# Patient Record
Sex: Male | Born: 1990 | Race: Black or African American | Hispanic: No | Marital: Single | Smoking: Current every day smoker
Health system: Southern US, Community
[De-identification: ages and names within clinical notes are randomized; demographics above are authoritative.]

---
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
## 2015-09-23 ENCOUNTER — Emergency Department
Admission: EM | Admit: 2015-09-23 | Discharge: 2015-09-23 | Disposition: A | Discharge status: Home / Self Care | Payer: Self-pay | Attending: Emergency Medicine | Admitting: Emergency Medicine

## 2015-09-23 ENCOUNTER — Encounter: Payer: Self-pay | Admitting: Emergency Medicine

## 2015-09-23 DIAGNOSIS — H109 Unspecified conjunctivitis: Secondary | ICD-10-CM | POA: Insufficient documentation

## 2015-09-23 DIAGNOSIS — F17210 Nicotine dependence, cigarettes, uncomplicated: Secondary | ICD-10-CM | POA: Insufficient documentation

## 2015-09-23 MED ORDER — SULFACETAMIDE SODIUM 10 % OP SOLN: OPHTHALMIC | Status: AC

## 2015-09-23 NOTE — ED Provider Notes (Signed)
[facility] Emergency Department Provider Note ____________________________________________  Time seen: Approximately 3:47 PM  I have reviewed the triage vital signs and the nursing notes.   HISTORY  Chief Complaint Conjunctivitis   HPI Colton Lang is a 25 y.o. male who presents to the emergency department for evaluation of bilateral eye irritation, redness, itching, and drainage. Symptoms started approximately 5 days ago and a right and the left eye became infected yesterday. He denies known exposures to conjunctivitis. He denies injury to the eyes He does not wear contact lenses. He has not been exposed to fire or welding.  History reviewed. No pertinent past medical history.  There are no active problems to display for this patient.   History reviewed. No pertinent past surgical history.  Current Outpatient Rx  Name  Route  Sig  Dispense  Refill  . sulfacetamide (BLEPH-10) 10 % ophthalmic solution      2 drops in each eye every 2 hours while awake for 2 days then 2 drops in both eyes every 4 hours for 5 days.   5 mL   0     Allergies Penicillins  No family history on file.  Social History Social History  Substance Use Topics  . Smoking status: Current Every Day Smoker -- 1.00 packs/day    Types: Cigarettes  . Smokeless tobacco: None  . Alcohol Use: Yes     Comment: occasional    Review of Systems   Constitutional: No fever/chills Eyes: Negative for visual changes. Negative for pain. Musculoskeletal: Negative for pain. Skin: Negative for rash. Neurological: Negative for headaches, focal weakness or numbness. Allergic: Negative for seasonal allergies. ____________________________________________  PHYSICAL EXAM:  VITAL SIGNS: ED Triage Vitals  Enc Vitals Group     BP 09/23/15 1544 121/73 mmHg     Pulse Rate 09/23/15 1544 77     Resp 09/23/15 1544 18     Temp 09/23/15 1544 98.5 F (36.9 C)     Temp Source 09/23/15  1544 Oral     SpO2 09/23/15 1544 97 %     Weight 09/23/15 1544 186 lb (84.369 kg)     Height 09/23/15 1544 5\' 8"  (1.727 m)     Head Cir --      Peak Flow --      Pain Score --      Pain Loc --      Pain Edu? --      Excl. in GC? --     Constitutional: Alert and oriented. Well appearing and in no acute distress. Eyes: Visual acuity--see nursing documentation; No globe trauma; Eyelids with purulent drainage in the lower lids; Sclera appears injected.  Eyelids not inverted. Conjunctiva appears erythematous, sparing the limbus; Cornea normal without fluorescein stain exam. Head: Atraumatic. Nose: No congestion/rhinnorhea. Mouth/Throat: Mucous membranes are moist.  Oropharynx non-erythematous. Respiratory: Respirations even and unlabored. Musculoskeletal:Normal ROM x 4 extremities. Neurologic:  Normal speech and language. No gross focal neurologic deficits are appreciated. Speech is normal. No gait instability. Skin:  Skin is warm, dry and intact. No rash noted. Psychiatric: Mood and affect are normal. Speech and behavior are normal.  ____________________________________________   LABS (all labs ordered are listed, but only abnormal results are displayed)  Labs Reviewed - No data to display ____________________________________________  EKG   ____________________________________________  RADIOLOGY   ____________________________________________   PROCEDURES  Procedure(s) performed: None  ____________________________________________   INITIAL IMPRESSION / ASSESSMENT AND PLAN / ED COURSE  Pertinent labs & imaging results that  were available during my care of the patient were reviewed by me and considered in my medical decision making (see chart for details).  Patient to receive prescriptions for Bleph-10.  He was advised to follow up with ophthalmology for symptoms that are not improving over the next 2-3 days. He was  also advised to return to the ER for symptoms that  change or worsen if unable to schedule an appointment.  ____________________________________________   FINAL CLINICAL IMPRESSION(S) / ED DIAGNOSES  Final diagnoses:  Bilateral conjunctivitis    Note:  This document was prepared using Dragon voice recognition software and may include unintentional dictation errors.    Chinita PesterCari B Serene Kopf, FNP 09/23/15 1811  Minna AntisKevin Paduchowski, MD 09/23/15 2008

## 2015-09-23 NOTE — ED Notes (Signed)
Pt presents to ED with left eye redness, swelling and tearing for 4-5 days. Pt denies any other symptoms except some nasal congestion.

## 2015-09-23 NOTE — Discharge Instructions (Signed)

## 2016-06-28 ENCOUNTER — Emergency Department
Admission: EM | Admit: 2016-06-28 | Discharge: 2016-06-28 | Disposition: A | Discharge status: Home / Self Care | Payer: Self-pay | Attending: Emergency Medicine | Admitting: Emergency Medicine

## 2016-06-28 ENCOUNTER — Encounter: Payer: Self-pay | Admitting: Emergency Medicine

## 2016-06-28 DIAGNOSIS — R519 Headache, unspecified: Secondary | ICD-10-CM

## 2016-06-28 DIAGNOSIS — R110 Nausea: Secondary | ICD-10-CM | POA: Insufficient documentation

## 2016-06-28 DIAGNOSIS — R51 Headache: Secondary | ICD-10-CM | POA: Insufficient documentation

## 2016-06-28 DIAGNOSIS — F17210 Nicotine dependence, cigarettes, uncomplicated: Secondary | ICD-10-CM | POA: Insufficient documentation

## 2016-06-28 MED ORDER — IBUPROFEN 600 MG PO TABS
600.0000 mg | ORAL_TABLET | Freq: Once | ORAL | Status: AC
  Administered 2016-06-28: 600 mg via ORAL
  Filled 2016-06-28: qty 1

## 2016-06-28 MED ORDER — BUTALBITAL-APAP-CAFFEINE 50-325-40 MG PO TABS
2.0000 | ORAL_TABLET | ORAL | Status: AC
  Administered 2016-06-28: 2 via ORAL
  Filled 2016-06-28: qty 2

## 2016-06-28 NOTE — Discharge Instructions (Signed)

## 2016-06-28 NOTE — ED Triage Notes (Signed)
Right side headache started tuesdaya nd is still there.  Says he has not taken anything for it.

## 2016-06-28 NOTE — ED Notes (Addendum)
Pt presents with headache x 2 days (after drinking ETOH); has not taken anything for it. He reports that he doesn't like to take meds; affirms nausea, photophobia. Pt also reports hx of headaches caused by stress. NAD noted.

## 2016-06-28 NOTE — ED Provider Notes (Signed)
[facility] Emergency Department Provider Note  ____________________________________________   First MD Initiated Contact with Patient 06/28/16 1055     (approximate)  I have reviewed the triage vital signs and the nursing notes.   HISTORY2  Chief Complaint Headache    HPI Colton Lang is a 26 y.o. male with no chronic medical history and a history of a few more mild headaches in the past who presents for evaluation of gradual onset headache for 2-3 days.  He states that he was drinking on Monday night and developed a headache during the night that night that has persisted.  He has had some nausea and light makes the pain worse.  He denies fever/chills, neck pain, neck stiffness, visual changes, numbness or tingling or weakness in his extremities, difficulty with balance or ambulation, vomiting, abdominal pain, diarrhea.  He reports the pain is severe, all over his head, and is a dull and throbbing pain.  He has not tried taking any medication at home because he states he does not like taking pills.  He has "just been lying around" because he does not feel well.He has had no history of trauma or injury.  His uncle has a history of migraines but he personally does not and has no other family members with migraines.  He has had no pain in or around his eyes.   History reviewed. No pertinent past medical history.  There are no active problems to display for this patient.   History reviewed. No pertinent surgical history.  Prior to Admission medications   Not on File    Allergies Penicillins  No family history on file.  Social History Social History  Substance Use Topics  . Smoking status: Current Every Day Smoker    Packs/day: 1.00    Types: Cigarettes  . Smokeless tobacco: Never Used  . Alcohol use Yes     Comment: occasional    Review of Systems Constitutional: No fever/chills Eyes: No visual changes. ENT: No sore  throat. Cardiovascular: Denies chest pain. Respiratory: Denies shortness of breath. Gastrointestinal: No abdominal pain.  Nausea, no vomiting.  No diarrhea.  No constipation. Genitourinary: Negative for dysuria. Musculoskeletal: Negative for back pain. Skin: Negative for rash. Neurological: Persistent global dull throbbing headache 2 days which is exacerbated by light. 10-point ROS otherwise negative.  ____________________________________________   PHYSICAL EXAM:  VITAL SIGNS: ED Triage Vitals  Enc Vitals Group     BP 06/28/16 0943 (!) 136/55     Pulse Rate 06/28/16 0943 64     Resp 06/28/16 0943 14     Temp 06/28/16 0943 98.9 F (37.2 C)     Temp Source 06/28/16 0943 Oral     SpO2 06/28/16 0943 96 %     Weight 06/28/16 0944 195 lb (88.5 kg)     Height 06/28/16 0944 5\' 8"  (1.727 m)     Head Circumference --      Peak Flow --      Pain Score 06/28/16 0943 8     Pain Loc --      Pain Edu? --      Excl. in GC? --     Constitutional: Alert and oriented. Well appearing and in no acute distress. Eyes: Conjunctivae are normal. PERRL. EOMI.  No nystagmus.  The light from the ophthalmoscope does seem to cause the patient increased discomfort  Head: Atraumatic. Nose: No congestion/rhinnorhea. Mouth/Throat: Mucous membranes are moist. Neck: No stridor.  No meningeal signs.  Cardiovascular: Normal rate, regular rhythm. Good peripheral circulation. Grossly normal heart sounds. Respiratory: Normal respiratory effort.  No retractions. Lungs CTAB. Gastrointestinal: Soft and nontender. No distention.  Musculoskeletal: No lower extremity tenderness nor edema. No gross deformities of extremities. Neurologic:  Normal speech and language. No gross focal neurologic deficits are appreciated.  Skin:  Skin is warm, dry and intact. No rash noted. Psychiatric: Mood and affect are normal. Speech and behavior are normal.  ____________________________________________   LABS (all labs ordered  are listed, but only abnormal results are displayed)  Labs Reviewed - No data to display ____________________________________________  EKG  None - EKG not ordered by ED physician ____________________________________________  RADIOLOGY   No results found.  ____________________________________________   PROCEDURES  Critical Care performed: No   Procedure(s) performed:   Procedures   ____________________________________________   INITIAL IMPRESSION / ASSESSMENT AND PLAN / ED COURSE  Pertinent labs & imaging results that were available during my care of the patient were reviewed by me and considered in my medical decision making (see chart for details).  The patient developed a gradual onset headache after drinking and it has persisted for 2 days.  I suspect he has a migraine based on the description of his symptoms.  I explained this to him and explained that the usual way of treating, particularly for patients who have tried oral medication at home and had failed and thus come to the emergency department, is to place an IV, provided fluids, and provided several different medications that can relieve the migraine.  He states he does not like needles and IVs and does not want any of that medicine.  I explained that we could try some pills by mouth but they are typically less effective and take longer, but he wants to do that and insists that he does not want an IV.  I will provide 2 tablets of Fioricet and ibuprofen 600 mg by mouth and then reassess.   Clinical Course as of Jun 28 1153  Wed Jun 28, 2016  1108 I reviewed the patient's prescription history over the last 12 months in the multi-state controlled substances database(s) that includes [place], [place], [place], [place], [place], [place], [place], [place], [place], [place], [place], [place], and [place].  The patient has filled no controlled substances during that time.    [CF]  1154 The patient reported to his nurse, Corrie DandyMary, that he is ready to go.  This was before he got the medicines.  I asked if he did want the medicines and he said yes.  He states that his headache feels better although he just got them and he does not want to stay any longer.  I gave my usual and customary return precautions.     [CF]    Clinical Course User Index [CF] Loleta Roseory Kenneth Lax, MD    ____________________________________________  FINAL CLINICAL IMPRESSION(S) / ED DIAGNOSES  Final diagnoses:  Acute nonintractable headache, unspecified headache type     MEDICATIONS GIVEN DURING THIS VISIT:  Medications  butalbital-acetaminophen-caffeine (FIORICET, ESGIC) 50-325-40 MG per tablet 2 tablet (2 tablets Oral Given 06/28/16 1145)  ibuprofen (ADVIL,MOTRIN) tablet 600 mg (600 mg Oral Given 06/28/16 1145)     NEW OUTPATIENT MEDICATIONS STARTED DURING THIS VISIT:  New Prescriptions   No medications on file    Modified Medications   No medications on file    Discontinued Medications   No medications on file     Note:  This document was prepared using  Dragon Chemical engineer and may include unintentional dictation errors.    Loleta Rose, MD 06/28/16 1155

## 2016-06-28 NOTE — ED Notes (Signed)
FIRST NURSE: headache x2 days , hx of similar headache

## 2017-10-01 ENCOUNTER — Emergency Department
Admission: EM | Admit: 2017-10-01 | Discharge: 2017-10-01 | Disposition: A | Discharge status: Home / Self Care | Payer: Self-pay | Attending: Emergency Medicine | Admitting: Emergency Medicine

## 2017-10-01 ENCOUNTER — Emergency Department: Payer: Self-pay

## 2017-10-01 ENCOUNTER — Other Ambulatory Visit: Payer: Self-pay

## 2017-10-01 DIAGNOSIS — Y998 Other external cause status: Secondary | ICD-10-CM | POA: Insufficient documentation

## 2017-10-01 DIAGNOSIS — S66901A Unspecified injury of unspecified muscle, fascia and tendon at wrist and hand level, right hand, initial encounter: Secondary | ICD-10-CM | POA: Insufficient documentation

## 2017-10-01 DIAGNOSIS — W228XXA Striking against or struck by other objects, initial encounter: Secondary | ICD-10-CM | POA: Insufficient documentation

## 2017-10-01 DIAGNOSIS — Y929 Unspecified place or not applicable: Secondary | ICD-10-CM | POA: Insufficient documentation

## 2017-10-01 DIAGNOSIS — F17210 Nicotine dependence, cigarettes, uncomplicated: Secondary | ICD-10-CM | POA: Insufficient documentation

## 2017-10-01 DIAGNOSIS — Y9389 Activity, other specified: Secondary | ICD-10-CM | POA: Insufficient documentation

## 2017-10-01 MED ORDER — TRIPLE ANTIBIOTIC 5-400-5000 EX OINT: TOPICAL_OINTMENT | Freq: Four times a day (QID) | CUTANEOUS | 0 refills | Status: DC

## 2017-10-01 MED ORDER — TRAMADOL HCL 50 MG PO TABS: 50.0000 mg | ORAL_TABLET | Freq: Four times a day (QID) | ORAL | 0 refills | Status: AC | PRN

## 2017-10-01 MED ORDER — TRIPLE ANTIBIOTIC 5-400-5000 EX OINT: TOPICAL_OINTMENT | Freq: Every day | CUTANEOUS | 0 refills | Status: DC

## 2017-10-01 MED ORDER — IBUPROFEN 800 MG PO TABS: 800.0000 mg | ORAL_TABLET | Freq: Three times a day (TID) | ORAL | 0 refills | Status: DC | PRN

## 2017-10-01 NOTE — ED Triage Notes (Addendum)
Right index finger injury after hitting a TV with it last night. Splinted on arrival. Pt denies any other complaints or recent illnesses.  Does report increase in stress over past few days.  Pt alert and oriented X4, active, cooperative, pt in NAD. RR even and unlabored, color WNL.

## 2017-10-01 NOTE — ED Notes (Signed)
Finger splint to left index finger.  Has 2 areas with abrasions and I left those areas open with no tape over them.

## 2017-10-01 NOTE — ED Provider Notes (Signed)
[facility] Emergency Department Provider Note  ____________________________________________  Time seen: Approximately 9:49 AM  I have reviewed the triage vital signs and the nursing notes.   HISTORY  Chief Complaint Finger Injury    HPI Colton Lang is a 27 y.o. male that presents to the emergency department for evaluation of right index finger injury.  Patient states that he was "thinking about something" when he got mad and hit the TV.  He is having pain over his right index finger.  He has having difficulty bending his finger.  He states that he feels like it does not bend, rather than being too painful to bend.  He is not having any pain over the rest of his hand and is not concerned that anything is broken.  No recent illness. No history of HTN. Tetanus shot was last year.    History reviewed. No pertinent past medical history.  There are no active problems to display for this patient.   History reviewed. No pertinent surgical history.  Prior to Admission medications   Medication Sig Start Date End Date Taking? Authorizing Provider  ibuprofen (ADVIL,MOTRIN) 800 MG tablet Take 1 tablet (800 mg total) by mouth every 8 (eight) hours as needed. 10/01/17   Enid DerryWagner, Demeco Ducksworth, PA-C  neomycin-bacitracin-polymyxin (NEOSPORIN) [id number] ointment Apply topically daily. 10/01/17   Enid DerryWagner, Jaevin Medearis, PA-C  traMADol (ULTRAM) 50 MG tablet Take 1 tablet (50 mg total) by mouth every 6 (six) hours as needed. 10/01/17 10/01/18  Enid DerryWagner, Irina Okelly, PA-C    Allergies Penicillins  No family history on file.  Social History Social History   Tobacco Use  . Smoking status: Current Every Day Smoker    Packs/day: 1.00    Types: Cigarettes  . Smokeless tobacco: Never Used  Substance Use Topics  . Alcohol use: Yes    Comment: occasional  . Drug use: No     Review of Systems  Constitutional: No fever/chills Cardiovascular: No chest pain. Respiratory: No  SOB. Gastrointestinal: No abdominal pain.  No nausea, no vomiting.  Musculoskeletal: Positive for finger pain.  Skin: Negative for rash, lacerations, ecchymosis.  Positive for abrasions. Neurological: Negative for headaches, numbness or tingling   ____________________________________________   PHYSICAL EXAM:  VITAL SIGNS: ED Triage Vitals  Enc Vitals Group     BP 10/01/17 0822 (!) 160/75     Pulse Rate 10/01/17 0822 91     Resp 10/01/17 0822 18     Temp 10/01/17 0822 (!) 100.6 F (38.1 C)     Temp Source 10/01/17 0822 Oral     SpO2 10/01/17 0822 91 %     Weight 10/01/17 0823 196 lb (88.9 kg)     Height 10/01/17 0823 5\' 8"  (1.727 m)     Head Circumference --      Peak Flow --      Pain Score 10/01/17 0823 10     Pain Loc --      Pain Edu? --      Excl. in GC? --      Constitutional: Alert and oriented. Well appearing and in no acute distress. Eyes: Conjunctivae are normal. PERRL. EOMI. Head: Atraumatic. ENT:      Ears:      Nose: No congestion/rhinnorhea.      Mouth/Throat: Mucous membranes are moist.  Neck: No stridor.   Cardiovascular: Normal rate, regular rhythm.  Good peripheral circulation. Respiratory: Normal respiratory effort without tachypnea or retractions. Lungs CTAB. Good air entry to the bases with no  decreased or absent breath sounds. Musculoskeletal: Full range of motion to all extremities. No gross deformities appreciated.  Finger held in flexion.  Pain with passive extension.  Unable to perform resisted extension or active extension.  Abrasions to knuckles. Neurologic:  Normal speech and language. No gross focal neurologic deficits are appreciated.  Skin:  Skin is warm, dry and intact. No rash noted. Psychiatric: Mood and affect are normal. Speech and behavior are normal. Patient exhibits appropriate insight and judgement.   ____________________________________________   LABS (all labs ordered are listed, but only abnormal results are  displayed)  Labs Reviewed - No data to display ____________________________________________  EKG   ____________________________________________  RADIOLOGY Lexine Baton, personally viewed and evaluated these images (plain radiographs) as part of my medical decision making, as well as reviewing the written report by the radiologist.  Dg Finger Index Right  Result Date: 10/01/2017 CLINICAL DATA:  Right index finger pain after injury last night. EXAM: RIGHT INDEX FINGER 2+V COMPARISON:  None. FINDINGS: There is no evidence of fracture or dislocation. There is no evidence of arthropathy or other focal bone abnormality. Soft tissues are unremarkable. IMPRESSION: Normal right index finger. Electronically Signed   By: Lupita Raider, M.D.   On: 10/01/2017 09:05    ____________________________________________    PROCEDURES  Procedure(s) performed:    Procedures    Medications - No data to display   ____________________________________________   INITIAL IMPRESSION / ASSESSMENT AND PLAN / ED COURSE  Pertinent labs & imaging results that were available during my care of the patient were reviewed by me and considered in my medical decision making (see chart for details).  Review of the [place] CSRS was performed in accordance of the [facility] prior to dispensing any controlled drugs.   Patient's diagnosis is consistent with extensor tendon injury.  Vital signs and exam are reassuring.  X-ray is negative for acute bony abnormalities.  Finger splint was placed.  Patient will not remove splint until evaluated by Ortho.  Patient will be discharged home with prescriptions for tramadol, ibuprofen, neosporin. Patient is to follow up with ortho as directed.  He is agreeable to call Ortho today for follow-up appointment.  Patient is given ED precautions to return to the ED for any worsening or new symptoms.     ____________________________________________  FINAL CLINICAL IMPRESSION(S) / ED  DIAGNOSES  Final diagnoses:  Injury of extensor tendon of hand, right, initial encounter      NEW MEDICATIONS STARTED DURING THIS VISIT:  ED Discharge Orders        Ordered    traMADol (ULTRAM) 50 MG tablet  Every 6 hours PRN     10/01/17 1032    ibuprofen (ADVIL,MOTRIN) 800 MG tablet  Every 8 hours PRN     10/01/17 1032    neomycin-bacitracin-polymyxin (NEOSPORIN) [phone] ointment  4 times daily,   Status:  Discontinued     10/01/17 1034    neomycin-bacitracin-polymyxin (NEOSPORIN) [phone] ointment  Daily     10/01/17 1034          This chart was dictated using voice recognition software/Dragon. Despite best efforts to proofread, errors can occur which can change the meaning. Any change was purely unintentional.    Enid Derry, PA-C 10/01/17 1524    Jene Every, MD 10/02/17 [phone]

## 2017-10-01 NOTE — Discharge Instructions (Addendum)
Your exam is consistent with an injury to the extensor tendon of the finger. No fracture was seen on xray. Please call orthopedics today for an appointment as soon as possible. Please wear splint until seen by orthopedics.

## 2017-10-01 NOTE — ED Notes (Signed)
See triage note   Presents with right index finger pain  Developed pain after hitting a TV presents with al splint to finger

## 2019-03-24 ENCOUNTER — Other Ambulatory Visit: Payer: Self-pay

## 2019-03-24 ENCOUNTER — Encounter: Payer: Self-pay | Admitting: Emergency Medicine

## 2019-03-24 ENCOUNTER — Emergency Department
Admission: EM | Admit: 2019-03-24 | Discharge: 2019-03-24 | Disposition: A | Discharge status: Home / Self Care | Payer: Self-pay | Attending: Emergency Medicine | Admitting: Emergency Medicine

## 2019-03-24 DIAGNOSIS — B349 Viral infection, unspecified: Secondary | ICD-10-CM | POA: Insufficient documentation

## 2019-03-24 DIAGNOSIS — Z20828 Contact with and (suspected) exposure to other viral communicable diseases: Secondary | ICD-10-CM | POA: Insufficient documentation

## 2019-03-24 DIAGNOSIS — F17210 Nicotine dependence, cigarettes, uncomplicated: Secondary | ICD-10-CM | POA: Insufficient documentation

## 2019-03-24 LAB — URINALYSIS, COMPLETE (UACMP) WITH MICROSCOPIC
Bacteria, UA: NONE SEEN
Bilirubin Urine: NEGATIVE
Glucose, UA: NEGATIVE mg/dL
Hgb urine dipstick: NEGATIVE
Ketones, ur: 20 mg/dL — AB
Leukocytes,Ua: NEGATIVE
Nitrite: NEGATIVE
Protein, ur: 100 mg/dL — AB
Specific Gravity, Urine: 1.027 (ref 1.005–1.030)
Squamous Epithelial / LPF: NONE SEEN (ref 0–5)
pH: 6 (ref 5.0–8.0)

## 2019-03-24 LAB — COMPREHENSIVE METABOLIC PANEL
ALT: 23 U/L (ref 0–44)
AST: 32 U/L (ref 15–41)
Albumin: 4.7 g/dL (ref 3.5–5.0)
Alkaline Phosphatase: 83 U/L (ref 38–126)
Anion gap: 11 (ref 5–15)
BUN: 9 mg/dL (ref 6–20)
CO2: 25 mmol/L (ref 22–32)
Calcium: 9.3 mg/dL (ref 8.9–10.3)
Chloride: 103 mmol/L (ref 98–111)
Creatinine, Ser: 1.01 mg/dL (ref 0.61–1.24)
GFR calc Af Amer: >60 mL/min (ref >60)
GFR calc non Af Amer: >60 mL/min (ref >60)
Glucose, Bld: 105 mg/dL — ABNORMAL HIGH (ref 70–99)
Potassium: 3.7 mmol/L (ref 3.5–5.1)
Sodium: 139 mmol/L (ref 135–145)
Total Bilirubin: 0.9 mg/dL (ref 0.3–1.2)
Total Protein: 7.7 g/dL (ref 6.5–8.1)

## 2019-03-24 LAB — CBC WITH DIFFERENTIAL/PLATELET
Abs Immature Granulocytes: 0.01 10*3/uL (ref 0.00–0.07)
Basophils Absolute: 0 10*3/uL (ref 0.0–0.1)
Basophils Relative: 1 %
Eosinophils Absolute: 0.1 10*3/uL (ref 0.0–0.5)
Eosinophils Relative: 1 %
HCT: 43 % (ref 39.0–52.0)
Hemoglobin: 16.1 g/dL (ref 13.0–17.0)
Immature Granulocytes: 0 %
Lymphocytes Relative: 54 %
Lymphs Abs: 3.5 10*3/uL (ref 0.7–4.0)
MCH: 33.5 pg (ref 26.0–34.0)
MCHC: 37.4 g/dL — ABNORMAL HIGH (ref 30.0–36.0)
MCV: 89.4 fL (ref 80.0–100.0)
Monocytes Absolute: 0.4 10*3/uL (ref 0.1–1.0)
Monocytes Relative: 7 %
Neutro Abs: 2.3 10*3/uL (ref 1.7–7.7)
Neutrophils Relative %: 37 %
Platelets: 324 10*3/uL (ref 150–400)
RBC: 4.81 MIL/uL (ref 4.22–5.81)
RDW: 11.4 % — ABNORMAL LOW (ref 11.5–15.5)
WBC: 6.3 10*3/uL (ref 4.0–10.5)
nRBC: 0 % (ref 0.0–0.2)

## 2019-03-24 LAB — LIPASE, BLOOD: Lipase: 35 U/L (ref 11–51)

## 2019-03-24 NOTE — ED Triage Notes (Signed)
Patient ambulatory to triage with steady gait, without difficulty or distress noted, mask in place; pt reports N/V and HA x wk; denies abd pain

## 2019-03-24 NOTE — ED Provider Notes (Signed)
[facility] Emergency Department Provider Note  Time seen: 8:56 PM  I have reviewed the triage vital signs and the nursing notes.   HISTORY  Chief Complaint Emesis and Headache   HPI Colton Lang is a 28 y.o. male with no significant past medical history presents to the emergency department for viral symptoms.  According to the patient for the past 8 days or so he has been experiencing intermittent body aches, chills but no measured fever at home, sore throat and occasional cough.  Patient was concerned that he could have Covid so he came to the emergency department.  Has not been tested per patient.  Patient states he works at [employer] and somebody close to him at his work recently tested positive as well.   History reviewed. No pertinent past medical history.  There are no problems to display for this patient.   History reviewed. No pertinent surgical history.  Prior to Admission medications   Medication Sig Start Date End Date Taking? Authorizing Provider  ibuprofen (ADVIL,MOTRIN) 800 MG tablet Take 1 tablet (800 mg total) by mouth every 8 (eight) hours as needed. 10/01/17   Enid Derry, PA-C  neomycin-bacitracin-polymyxin (NEOSPORIN) [phone] ointment Apply topically daily. 10/01/17   Enid Derry, PA-C    Allergies  Allergen Reactions  . Penicillins Other (See Comments)    Pt mother told him he is allergic, reaction unknown    No family history on file.  Social History Social History   Tobacco Use  . Smoking status: Current Every Day Smoker    Packs/day: 1.00    Types: Cigarettes  . Smokeless tobacco: Never Used  Substance Use Topics  . Alcohol use: Yes    Comment: occasional  . Drug use: No    Review of Systems Constitutional: Chills but no fever. ENT: Sore throat. Cardiovascular: Negative for chest pain. Respiratory: Negative for shortness of breath.  Occasional cough. Gastrointestinal: Negative for abdominal pain.   Occasional nausea and vomiting. Musculoskeletal: Negative for musculoskeletal complaints Neurological: Negative for headache All other ROS negative  ____________________________________________  PHYSICAL EXAM:  VITAL SIGNS: ED Triage Vitals  Enc Vitals Group     BP 03/24/19 1947 (!) 150/89     Pulse Rate 03/24/19 1947 86     Resp 03/24/19 1947 18     Temp 03/24/19 1947 98.6 F (37 C)     Temp Source 03/24/19 1947 Oral     SpO2 03/24/19 1947 100 %     Weight 03/24/19 1948 196 lb (88.9 kg)     Height 03/24/19 1948 (!) 9" (0.229 m)     Head Circumference --      Peak Flow --      Pain Score 03/24/19 1947 4     Pain Loc --      Pain Edu? --      Excl. in GC? --    Constitutional: Alert and oriented. Well appearing and in no distress. Eyes: Normal exam ENT      Head: Normocephalic and atraumatic.      Mouth/Throat: Mucous membranes are moist. Cardiovascular: Normal rate, regular rhythm.  Respiratory: Normal respiratory effort without tachypnea nor retractions. Breath sounds are clear Gastrointestinal: Soft and nontender. No distention.   Musculoskeletal: Nontender with normal range of motion in all extremities.  Neurologic:  Normal speech and language. No gross focal neurologic deficits  Skin:  Skin is warm, dry and intact.  Psychiatric: Mood and affect are normal  ____________________________________________   INITIAL IMPRESSION /  ASSESSMENT AND PLAN / ED COURSE  Pertinent labs & imaging results that were available during my care of the patient were reviewed by me and considered in my medical decision making (see chart for details).   Patient presents emergency department for 8 days of viral symptoms including occasional body aches chills sore throat occasional cough.  Overall the patient appears well reassuring vitals, reassuring physical exam.  Lab work largely within normal limits besides mild dehydration.  Discussed with the patient the importance of continued  supportive care at home including Tylenol or ibuprofen, fluids and plenty of rest.  We will test for Covid and provide a work note for the patient.  I discussed quarantine precautions until his Covid result is known.  Colton Lang was evaluated in Emergency Department on 03/24/2019 for the symptoms described in the history of present illness. He was evaluated in the context of the global COVID-19 pandemic, which necessitated consideration that the patient might be at risk for infection with the SARS-CoV-2 virus that causes COVID-19. Institutional protocols and algorithms that pertain to the evaluation of patients at risk for COVID-19 are in a state of rapid change based on information released by regulatory bodies including the CDC and federal and state organizations. These policies and algorithms were followed during the patient's care in the ED.  ____________________________________________   FINAL CLINICAL IMPRESSION(S) / ED DIAGNOSES  Viral illness   Harvest Dark, MD 03/24/19 2100

## 2019-03-25 ENCOUNTER — Telehealth: Payer: Self-pay

## 2019-03-25 LAB — SARS CORONAVIRUS 2 (TAT 6-24 HRS): SARS Coronavirus 2: NEGATIVE

## 2019-03-25 NOTE — Telephone Encounter (Signed)
Pt notified of negative COVID-19 results. Understanding verbalized.  Colton Lang   

## 2019-07-29 ENCOUNTER — Encounter: Payer: Self-pay | Admitting: Emergency Medicine

## 2019-07-29 ENCOUNTER — Other Ambulatory Visit: Payer: Self-pay

## 2019-07-29 ENCOUNTER — Emergency Department
Admission: EM | Admit: 2019-07-29 | Discharge: 2019-07-29 | Disposition: A | Discharge status: Home / Self Care | Payer: HRSA Program | Attending: Student | Admitting: Student

## 2019-07-29 DIAGNOSIS — R509 Fever, unspecified: Secondary | ICD-10-CM | POA: Diagnosis present

## 2019-07-29 DIAGNOSIS — B349 Viral infection, unspecified: Secondary | ICD-10-CM | POA: Insufficient documentation

## 2019-07-29 DIAGNOSIS — F17210 Nicotine dependence, cigarettes, uncomplicated: Secondary | ICD-10-CM | POA: Insufficient documentation

## 2019-07-29 DIAGNOSIS — Z20822 Contact with and (suspected) exposure to covid-19: Secondary | ICD-10-CM | POA: Insufficient documentation

## 2019-07-29 LAB — SARS CORONAVIRUS 2 (TAT 6-24 HRS): SARS Coronavirus 2: NEGATIVE

## 2019-07-29 MED ORDER — ONDANSETRON 4 MG PO TBDP
4.0000 mg | ORAL_TABLET | Freq: Once | ORAL | Status: AC
Start: 2019-07-29 — End: 2019-07-29
  Administered 2019-07-29: 11:00:00 4 mg via ORAL
  Filled 2019-07-29: qty 1

## 2019-07-29 MED ORDER — ONDANSETRON HCL 4 MG PO TABS: 4.0000 mg | ORAL_TABLET | Freq: Every day | ORAL | 0 refills | Status: AC | PRN

## 2019-07-29 NOTE — ED Provider Notes (Signed)
[facility] Emergency Department Provider Note  ____________________________________________  Time seen: Approximately 10:21 AM  I have reviewed the triage vital signs and the nursing notes.   HISTORY  Chief Complaint Fever    HPI Colton Lang is a 29 y.o. male that presents to the emergency department for evaluation of headache, fever, nasal congestion, vomiting, diarrhea for 2 days.  Last episode of vomiting was yesterday.  Patient had one episode of diarrhea this morning.  He has not had a fever since Sunday night.  He is drinking fluids.  He has several coworkers with COVID-19 and came to the emergency department for testing.  No cough, shortness of breath, chest pain.  History reviewed. No pertinent past medical history.  There are no problems to display for this patient.   History reviewed. No pertinent surgical history.  Prior to Admission medications   Medication Sig Start Date End Date Taking? Authorizing Provider  ondansetron (ZOFRAN) 4 MG tablet Take 1 tablet (4 mg total) by mouth daily as needed for nausea or vomiting. 07/29/19 07/28/20  Enid Derry, PA-C    Allergies Penicillins  No family history on file.  Social History Social History   Tobacco Use  . Smoking status: Current Every Day Smoker    Packs/day: 1.00    Types: Cigarettes  . Smokeless tobacco: Never Used  Substance Use Topics  . Alcohol use: Yes    Comment: occasional  . Drug use: No     Review of Systems  Constitutional: No fever/chills today Eyes: No visual changes. No discharge. ENT: Positive for congestion and rhinorrhea. Cardiovascular: No chest pain. Respiratory: Negative for cough. No SOB. Gastrointestinal: No abdominal pain.  No nausea, no vomiting today.  Diarrhea x1 today.  No constipation. Musculoskeletal: Negative for musculoskeletal pain. Skin: Negative for rash, abrasions, lacerations, ecchymosis. Neurological: Negative for  headaches.   ____________________________________________   PHYSICAL EXAM:  VITAL SIGNS: ED Triage Vitals [07/29/19 0841]  Enc Vitals Group     BP (!) 143/76     Pulse Rate 67     Resp 16     Temp 98.6 F (37 C)     Temp Source Oral     SpO2 100 %     Weight 196 lb (88.9 kg)     Height 5\' 9"  (1.753 m)     Head Circumference      Peak Flow      Pain Score 8     Pain Loc      Pain Edu?      Excl. in GC?      Constitutional: Alert and oriented. Well appearing and in no acute distress. Eyes: Conjunctivae are normal. PERRL. EOMI. No discharge. Head: Atraumatic. ENT: No frontal and maxillary sinus tenderness.      Ears: Tympanic membranes pearly gray with good landmarks. No discharge.      Nose: Mild congestion/rhinnorhea.      Mouth/Throat: Mucous membranes are moist.. Neck: No stridor.   Hematological/Lymphatic/Immunilogical: No cervical lymphadenopathy. Cardiovascular: Normal rate, regular rhythm.  Good peripheral circulation. Respiratory: Normal respiratory effort without tachypnea or retractions. Lungs CTAB. Good air entry to the bases with no decreased or absent breath sounds. Gastrointestinal: Bowel sounds 4 quadrants. Soft and nontender to palpation. No guarding or rigidity. No palpable masses. No distention. Musculoskeletal: Full range of motion to all extremities. No gross deformities appreciated. Neurologic:  Normal speech and language. No gross focal neurologic deficits are appreciated.  Skin:  Skin is warm, dry and  intact. No rash noted. Psychiatric: Mood and affect are normal. Speech and behavior are normal. Patient exhibits appropriate insight and judgement.   ____________________________________________   LABS (all labs ordered are listed, but only abnormal results are displayed)  Labs Reviewed  SARS CORONAVIRUS 2 (TAT 6-24 HRS)   ____________________________________________  EKG   ____________________________________________  RADIOLOGY  No  results found.  ____________________________________________    PROCEDURES  Procedure(s) performed:    Procedures    Medications  ondansetron (ZOFRAN-ODT) disintegrating tablet 4 mg (4 mg Oral Given 07/29/19 1047)     ____________________________________________   INITIAL IMPRESSION / ASSESSMENT AND PLAN / ED COURSE  Pertinent labs & imaging results that were available during my care of the patient were reviewed by me and considered in my medical decision making (see chart for details).  Review of the  [place]RS was performed in accordance of the [place] prior to dispensing any controlled drugs.   Patient presented to the emergency department for viral symptoms requesting COVID-19 testing. Vital signs and exam are reassuring. He has not had any fever or vomiting today.  He has only had one episode of diarrhea today.  He is tolerating oral intake.  He denies any shortness of breath, chest pain, abdominal pain.  Patient appears staying well hydrated.  Patient feels comfortable going home. Patient will be discharged home with prescriptions for Zofran. Patient is to follow up with primary care as needed or otherwise directed. Patient is given ED precautions to return to the ED for any worsening or new symptoms.  Colton Lang was evaluated in Emergency Department on 07/29/2019 for the symptoms described in the history of present illness. He was evaluated in the context of the global COVID-19 pandemic, which necessitated consideration that the patient might be at risk for infection with the SARS-CoV-2 virus that causes COVID-19. Institutional protocols and algorithms that pertain to the evaluation of patients at risk for COVID-19 are in a state of rapid change based on information released by regulatory bodies including the CDC and federal and state organizations. These policies and algorithms were followed during the patient's care in the  ED.   ____________________________________________  FINAL CLINICAL IMPRESSION(S) / ED DIAGNOSES  Final diagnoses:  Viral illness      NEW MEDICATIONS STARTED DURING THIS VISIT:  ED Discharge Orders         Ordered    ondansetron (ZOFRAN) 4 MG tablet  Daily PRN     07/29/19 1030              This chart was dictated using voice recognition software/Dragon. Despite best efforts to proofread, errors can occur which can change the meaning. Any change was purely unintentional.    Laban Emperor, PA-C 07/29/19 1137    Lilia Pro., MD 07/29/19 1235

## 2019-07-29 NOTE — ED Triage Notes (Signed)
Says weak, fever, headaches, some diarrhea and vomiting since Monday.  Says people at work have covid. Lat vomit yesterday.

## 2019-07-29 NOTE — ED Notes (Signed)
See triage note   Presents with "not feeling well"  Subjective fever yesterday   Headache and diarrhea

## 2019-10-10 IMAGING — DX DG FINGER INDEX 2+V*R*
3 series · 3 of 3 positions shown · non-contrast
Comparison: None.

CLINICAL DATA: Right index finger pain after injury last night.

EXAM:
RIGHT INDEX FINGER 2+V

[finger obl]
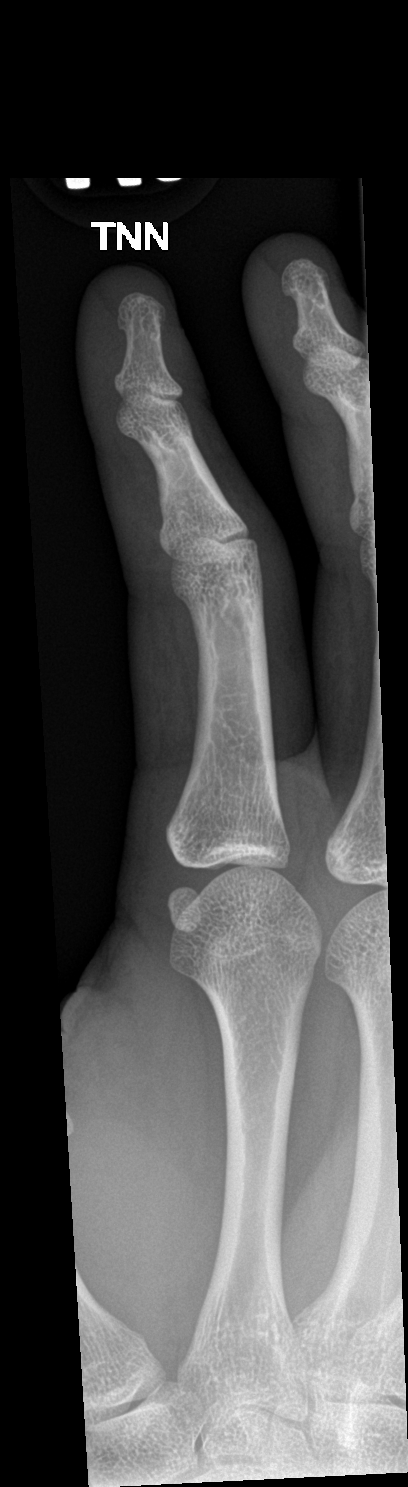

[finger lat]
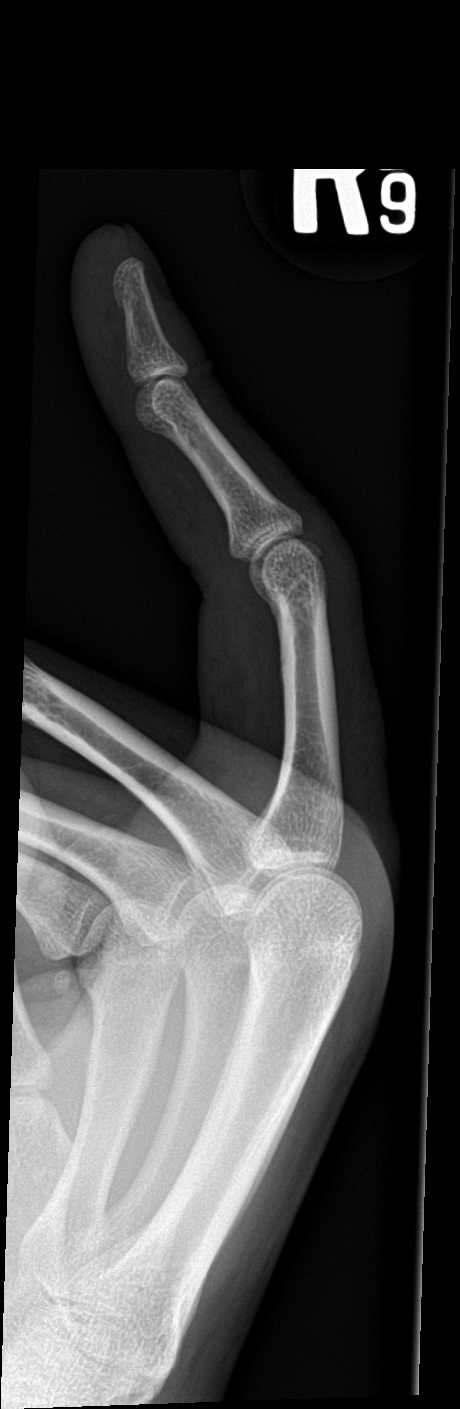

[finger ap]
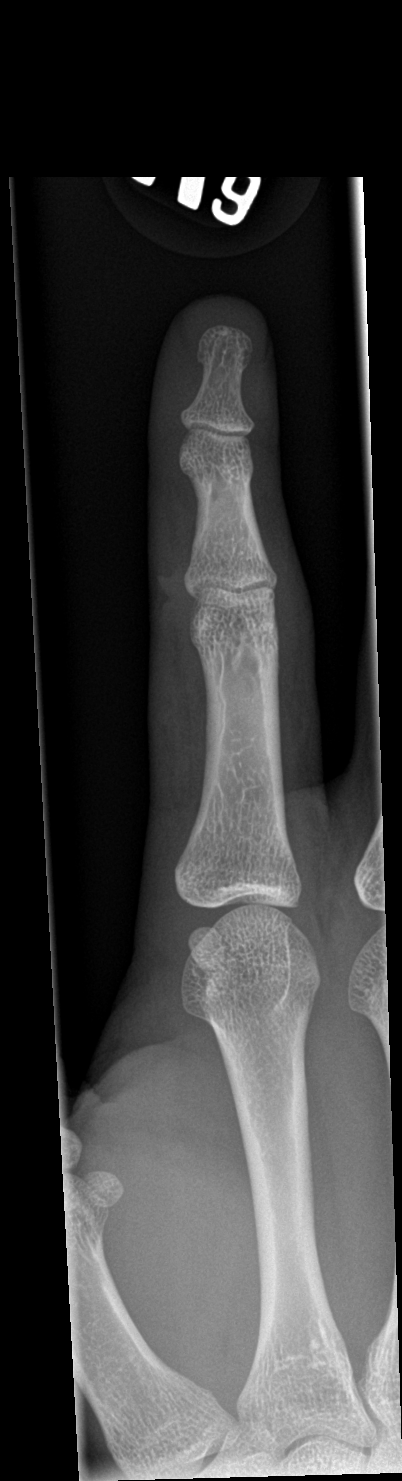

[3 of 3 positions shown; findings below may reference images not displayed]

FINDINGS: There is no evidence of fracture or dislocation. There is no
evidence of arthropathy or other focal bone abnormality. Soft
tissues are unremarkable.
IMPRESSION: Normal right index finger.

## 2020-10-15 ENCOUNTER — Other Ambulatory Visit: Payer: Self-pay

## 2020-10-15 ENCOUNTER — Encounter: Payer: Self-pay | Admitting: Family Medicine

## 2020-10-15 ENCOUNTER — Ambulatory Visit: Payer: Self-pay | Admitting: Family Medicine

## 2020-10-15 DIAGNOSIS — Z113 Encounter for screening for infections with a predominantly sexual mode of transmission: Secondary | ICD-10-CM

## 2020-10-15 LAB — GRAM STAIN

## 2020-10-15 NOTE — Progress Notes (Signed)
Gram stained reviewed, providers orders completed.

## 2020-10-15 NOTE — Progress Notes (Signed)
    [facility] Department STI clinic/screening visit  Subjective:  Colton Lang is a 30 y.o. male being seen today for an STI screening visit. The patient reports they do not have symptoms.    Patient has the following medical conditions:  There are no problems to display for this patient.    Chief Complaint  Patient presents with   SEXUALLY TRANSMITTED DISEASE    HPI  Patient reports here for screening, condom came off.     See flowsheet for further details and programmatic requirements.    The following portions of the patient's history were reviewed and updated as appropriate: allergies, current medications, past medical history, past social history, past surgical history and problem list.  Objective:  There were no vitals filed for this visit.  Physical Exam Constitutional:      Appearance: Normal appearance.  HENT:     Head: Normocephalic.     Mouth/Throat:     Mouth: Mucous membranes are moist.     Pharynx: Oropharynx is clear. No oropharyngeal exudate.  Genitourinary:    Penis: Normal.      Testes: Normal.     Comments: No lice, nits, or pest, no lesions or odor discharge.  Denies pain or tenderness with paplation of testicles.  No lesions, ulcers or masses present.    Musculoskeletal:     Cervical back: Normal range of motion and neck supple.  Lymphadenopathy:     Cervical: No cervical adenopathy.  Skin:    General: Skin is warm and dry.     Findings: No bruising, erythema, lesion or rash.  Neurological:     Mental Status: He is alert and oriented to person, place, and time.  Psychiatric:        Mood and Affect: Mood normal.        Behavior: Behavior normal.      Assessment and Plan:  Colton Lang is a 30 y.o. male presenting to the [facility] Department for STI screening  1. Screening examination for venereal disease - Gonococcus culture - Gram stain - Gonococcus culture Patient does not have STI  symptoms Patient accepted all screenings including  gram stain,  oral, urethral GC and declines bloodwork for HIV/RPR.  Patient meets criteria for HepB screening? Yes. Ordered? No - declines  Patient meets criteria for HepC screening? Yes. Ordered? No - declines  Recommended condom use with all sex Discussed importance of condom use for STI prevent  Treat gram stain per standing order Discussed time line for State Lab results and that patient will be called with positive results and encouraged patient to call if he had not heard in 2 weeks Recommended returning for continued or worsening symptoms.       No follow-ups on file.  Future Appointments  Date Time Provider Department Center  10/15/2020 11:00 AM Wendi Snipes, FNP [facility] None    Wendi Snipes, FNP

## 2020-10-19 LAB — GONOCOCCUS CULTURE

## 2021-03-06 ENCOUNTER — Emergency Department: Payer: Self-pay

## 2021-03-06 ENCOUNTER — Encounter: Payer: Self-pay | Admitting: Emergency Medicine

## 2021-03-06 ENCOUNTER — Other Ambulatory Visit: Payer: Self-pay

## 2021-03-06 ENCOUNTER — Emergency Department
Admission: EM | Admit: 2021-03-06 | Discharge: 2021-03-06 | Disposition: A | Discharge status: Home / Self Care | Payer: Self-pay | Attending: Emergency Medicine | Admitting: Emergency Medicine

## 2021-03-06 DIAGNOSIS — Z20822 Contact with and (suspected) exposure to covid-19: Secondary | ICD-10-CM | POA: Insufficient documentation

## 2021-03-06 DIAGNOSIS — J020 Streptococcal pharyngitis: Secondary | ICD-10-CM | POA: Insufficient documentation

## 2021-03-06 LAB — GROUP A STREP BY PCR: Group A Strep by PCR: DETECTED — AB

## 2021-03-06 LAB — RESP PANEL BY RT-PCR (FLU A&B, COVID) ARPGX2
Influenza A by PCR: NEGATIVE
Influenza B by PCR: NEGATIVE
SARS Coronavirus 2 by RT PCR: NEGATIVE

## 2021-03-06 MED ORDER — CEPHALEXIN 500 MG PO CAPS: 500.0000 mg | ORAL_CAPSULE | Freq: Two times a day (BID) | ORAL | 0 refills | Status: AC

## 2021-03-06 NOTE — ED Triage Notes (Signed)
Pt reports cough, congestion and sore throat since Thursday. Pt states unsure of fevers.

## 2021-03-06 NOTE — ED Notes (Signed)
Dc ppw provided to patient. Followup information provided. RX information given. Questions answered. Pt provides verbal consent for discharge. VS declined at dc. Pt assisted off unit. 

## 2021-03-06 NOTE — Discharge Instructions (Signed)
Take tylenol or ibuprofen for pain or fever. Follow up with primary care or return to the ER for symptoms of concern.

## 2021-03-06 NOTE — ED Provider Notes (Signed)
HPI: Pt is a 30 y.o. male who presents with complaints of sore throat  The patient p/w  sore throat Thursday, cough   ROS: Denies fever, chest pain, vomiting  History reviewed. No pertinent past medical history. There were no vitals filed for this visit.  Focused Physical Exam: Gen: No acute distress Head: atraumatic, normocephalic Eyes: Extraocular movements grossly intact; conjunctiva clear CV: RRR Lung: No increased WOB, no stridor GI: ND, no obvious masses Neuro: Alert and awake  Medical Decision Making and Plan: Given the patient's initial medical screening exam, the following diagnostic evaluation has been ordered. The patient will be placed in the appropriate treatment space, once one is available, to complete the evaluation and treatment. I have discussed the plan of care with the patient and I have advised the patient that an ED physician or mid-level practitioner will reevaluate their condition after the test results have been received, as the results may give them additional insight into the type of treatment they may need.   Diagnostics: strep, covid   Treatments: none immediately   Concha Se, MD 03/06/21 1049

## 2021-05-03 ENCOUNTER — Other Ambulatory Visit: Payer: Self-pay

## 2021-05-03 ENCOUNTER — Emergency Department
Admission: EM | Admit: 2021-05-03 | Discharge: 2021-05-03 | Disposition: A | Discharge status: Home / Self Care | Payer: Self-pay | Attending: Emergency Medicine | Admitting: Emergency Medicine

## 2021-05-03 DIAGNOSIS — Z20822 Contact with and (suspected) exposure to covid-19: Secondary | ICD-10-CM | POA: Insufficient documentation

## 2021-05-03 DIAGNOSIS — J020 Streptococcal pharyngitis: Secondary | ICD-10-CM | POA: Insufficient documentation

## 2021-05-03 LAB — GROUP A STREP BY PCR: Group A Strep by PCR: DETECTED — AB

## 2021-05-03 LAB — RESP PANEL BY RT-PCR (FLU A&B, COVID) ARPGX2
Influenza A by PCR: NEGATIVE
Influenza B by PCR: NEGATIVE
SARS Coronavirus 2 by RT PCR: NEGATIVE

## 2021-05-03 MED ORDER — CEFDINIR 300 MG PO CAPS: 300.0000 mg | ORAL_CAPSULE | Freq: Two times a day (BID) | ORAL | 0 refills | Status: AC

## 2021-05-03 NOTE — ED Provider Notes (Signed)
[facility] Provider Note    Event Date/Time   First MD Initiated Contact with Patient 05/03/21 [phone]     (approximate)   History   Chemical Exposure   HPI  Colton Lang is a 31 y.o. male presents emergency department complaining of sore throat, body aches, no known fever.  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  Felt fine Saturday night.  Sunday awoke with sore throat and body aches.  Felt a little lightheaded at work.  Has a cough.  No fever.  No chest pain or shortness of breath.      Physical Exam   Triage Vital Signs: ED Triage Vitals  Enc Vitals Group     BP 05/03/21 0928 130/78     Pulse Rate 05/03/21 0928 88     Resp 05/03/21 0928 18     Temp 05/03/21 0928 98 F (36.7 C)     Temp Source 05/03/21 0928 Oral     SpO2 05/03/21 0928 98 %     Weight 05/03/21 0917 195 lb 15.8 oz (88.9 kg)     Height 05/03/21 0917 5\' 7"  (1.702 m)     Head Circumference --      Peak Flow --      Pain Score 05/03/21 0928 5     Pain Loc --      Pain Edu? --      Excl. in GC? --     Most recent vital signs: Vitals:   05/03/21 0928  BP: 130/78  Pulse: 88  Resp: 18  Temp: 98 F (36.7 C)  SpO2: 98%     General: Awake, no distress.   CV:  Good peripheral perfusion. regular rate and  rhythm Resp:  Normal effort. Lungs CTA Abd:  No distention.   Other:  Throat is red and swollen, tonsillar area is tender to palpation, no cervical lymphadenopathy noted, no swelling beneath tongue   ED Results / Procedures / Treatments   Labs (all labs ordered are listed, but only abnormal results are displayed) Labs Reviewed  GROUP A STREP BY PCR - Abnormal; Notable for the following components:      Result Value   Group A Strep by PCR DETECTED (*)    All other components within normal limits  RESP PANEL BY RT-PCR (FLU A&B, COVID) [id number]      EKG     RADIOLOGY     PROCEDURES:   Procedures   MEDICATIONS ORDERED IN ED: Medications - No data to display   IMPRESSION / MDM / ASSESSMENT AND PLAN / ED COURSE  I reviewed the triage vital signs and the nursing notes.                              Differential diagnosis includes, but is not limited to, strep throat, viral pharyngitis, COVID, influenza.  I explained to the patient I do not think this has anything to do with insulation.  He should have felt bad on Saturday.  He states he has felt a little achy and was around someone that had strep throat.  Therefore we will order strep and respiratory panel.  Strep test is positive, respiratory panel is negative for COVID or influenza  Did explain the findings to the patient.  States he had strep 2 months ago history of amoxicillin and now has recurrent symptoms, will place on  Omnicef 300 twice daily for 10 days.  Patient was given strict instructions to finish the medication.  Gargle warm salt water.  Follow with ENT in 1 week if not improving.  Return emergency department worsening.  Is given a work note and discharged stable condition.         FINAL CLINICAL IMPRESSION(S) / ED DIAGNOSES   Final diagnoses:  Acute streptococcal pharyngitis     Rx / DC Orders   ED Discharge Orders          Ordered    cefdinir (OMNICEF) 300 MG capsule  2 times daily        05/03/21 1101             Note:  This document was prepared using Dragon voice recognition software and may include unintentional dictation errors.    Faythe Ghee, PA-C 05/03/21 1224    Jene Every, MD 05/03/21 1300

## 2021-05-03 NOTE — Discharge Instructions (Signed)
Take the antibiotic until all pills are gone. Return emergency department worsening Gargle with warm salt water

## 2021-05-03 NOTE — ED Triage Notes (Signed)
Pt states he was exposed to insulation without a mask and today having sore throat and some feeling lightheaded about 1.5hrs into work this morning.

## 2021-09-06 ENCOUNTER — Encounter: Payer: Self-pay | Admitting: Emergency Medicine

## 2021-09-06 ENCOUNTER — Other Ambulatory Visit: Payer: Self-pay

## 2021-09-06 ENCOUNTER — Emergency Department
Admission: EM | Admit: 2021-09-06 | Discharge: 2021-09-06 | Disposition: A | Discharge status: Home / Self Care | Payer: Self-pay | Attending: Emergency Medicine | Admitting: Emergency Medicine

## 2021-09-06 DIAGNOSIS — R109 Unspecified abdominal pain: Secondary | ICD-10-CM | POA: Insufficient documentation

## 2021-09-06 DIAGNOSIS — R112 Nausea with vomiting, unspecified: Secondary | ICD-10-CM | POA: Insufficient documentation

## 2021-09-06 DIAGNOSIS — R197 Diarrhea, unspecified: Secondary | ICD-10-CM | POA: Insufficient documentation

## 2021-09-06 MED ORDER — LOPERAMIDE HCL 2 MG PO TABS: 2.0000 mg | ORAL_TABLET | Freq: Four times a day (QID) | ORAL | 0 refills | Status: AC | PRN

## 2021-09-06 MED ORDER — ONDANSETRON 4 MG PO TBDP: 4.0000 mg | ORAL_TABLET | Freq: Three times a day (TID) | ORAL | 0 refills | Status: AC | PRN

## 2021-09-06 NOTE — Discharge Instructions (Addendum)
-  You may take the ondansetron as needed for nausea/vomiting.  -You may take the loperamide as needed for nausea/vomiting.  -Return to the emergency department anytime if you begin to experience any new or worsening symptoms.

## 2021-09-06 NOTE — ED Provider Notes (Signed)
[facility] Provider Note    None    (approximate)   History   Chief Complaint Abdominal Pain   HPI Colton Lang is a 31 y.o. male, no remarkable medical history, presents to the emergency department for evaluation of abdominal cramping, nausea/vomiting, and diarrhea that started last night.  He believes it may be related to some food that he had at a hot dog joint.  Denies fever/chills, flank pain, chest pain, shortness of breath, urinary symptoms, hematemesis, hematochezia, rash/lesions, or dizziness/lightheadedness  History Limitations: No limitations        Physical Exam  Triage Vital Signs: ED Triage Vitals  Enc Vitals Group     BP 09/06/21 1203 (!) 147/94     Pulse Rate 09/06/21 1203 80     Resp 09/06/21 1203 16     Temp 09/06/21 1203 98.4 F (36.9 C)     Temp Source 09/06/21 1203 Oral     SpO2 09/06/21 1203 98 %     Weight 09/06/21 1142 195 lb 15.8 oz (88.9 kg)     Height 09/06/21 1142 5\' 7"  (1.702 m)     Head Circumference --      Peak Flow --      Pain Score 09/06/21 1142 5     Pain Loc --      Pain Edu? --      Excl. in GC? --     Most recent vital signs: Vitals:   09/06/21 1203  BP: (!) 147/94  Pulse: 80  Resp: 16  Temp: 98.4 F (36.9 C)  SpO2: 98%    General: Awake, NAD.  Skin: Warm, dry. No rashes or lesions.  Eyes: PERRL. Conjunctivae normal.  CV: Good peripheral perfusion.  Resp: Normal effort.  Abd: Soft, non-tender. No distention.  Neuro: At baseline. No gross neurological deficits.   Focused Exam: N/A.  Physical Exam    ED Results / Procedures / Treatments  Labs (all labs ordered are listed, but only abnormal results are displayed) Labs Reviewed - No data to display   EKG N/A   RADIOLOGY  ED Provider Interpretation: N/A  No results found.  PROCEDURES:  Critical Care performed: N/A.  Procedures    MEDICATIONS ORDERED IN ED: Medications - No data to display   IMPRESSION /  MDM / ASSESSMENT AND PLAN / ED COURSE  I reviewed the triage vital signs and the nursing notes.                              Differential diagnosis includes, but is not limited to, viral gastroenteritis, influenza, COVID-19, nephrolithiasis  ED Course Patient peers well, vitals within normal limits for the patient.  NAD.  Assessment/Plan Presentation consistent with viral gastroenteritis, though did advise patient that I would advise getting some lab work to further evaluate.  Patient states that he is not interested in any diagnostic work-up as he believes that this is benign food poisoning.  He states that he only wants some medications to help with the symptoms.  Given his history and presentation, I believe this is reasonable.  Advised him that he could return to the emergency department anytime if his symptoms worsen.  We will provide him with a prescription for ondansetron and loperamide.  We will plan to discharge from triage  Patient's presentation is most consistent with acute, uncomplicated illness.   Provided the patient with anticipatory guidance, return precautions, and educational  material. Encouraged the patient to return to the emergency department at any time if they begin to experience any new or worsening symptoms. Patient expressed understanding and agreed with the plan.       FINAL CLINICAL IMPRESSION(S) / ED DIAGNOSES   Final diagnoses:  Nausea vomiting and diarrhea     Rx / DC Orders   ED Discharge Orders          Ordered    ondansetron (ZOFRAN-ODT) 4 MG disintegrating tablet  Every 8 hours PRN        09/06/21 1208    loperamide (IMODIUM A-D) 2 MG tablet  4 times daily PRN        09/06/21 1208             Note:  This document was prepared using Dragon voice recognition software and may include unintentional dictation errors.   Varney Daily, [place] 09/06/21 1213    Chesley Noon, MD 09/06/21 1531

## 2021-09-06 NOTE — ED Notes (Signed)
Patient returned stating his work's HR requires 3 days off work for his virus. Mardee Postin, PA called and telephone order received to extend not to the 9th of June

## 2021-09-06 NOTE — ED Triage Notes (Signed)
C/O abdominal cramping, nausea, vomiting and diarrhea since last night.    AAOx3.  Skin warm and dry. NAD

## 2023-03-15 IMAGING — CR DG CHEST 2V
1 series · 2 of 2 positions shown · non-contrast
Comparison: None.

CLINICAL DATA: Cough.

EXAM:
CHEST - 2 VIEW

[Series 1: dg chest 2 view · 0.14mm/px · 2 of 2 slices shown]
[im 1/2]
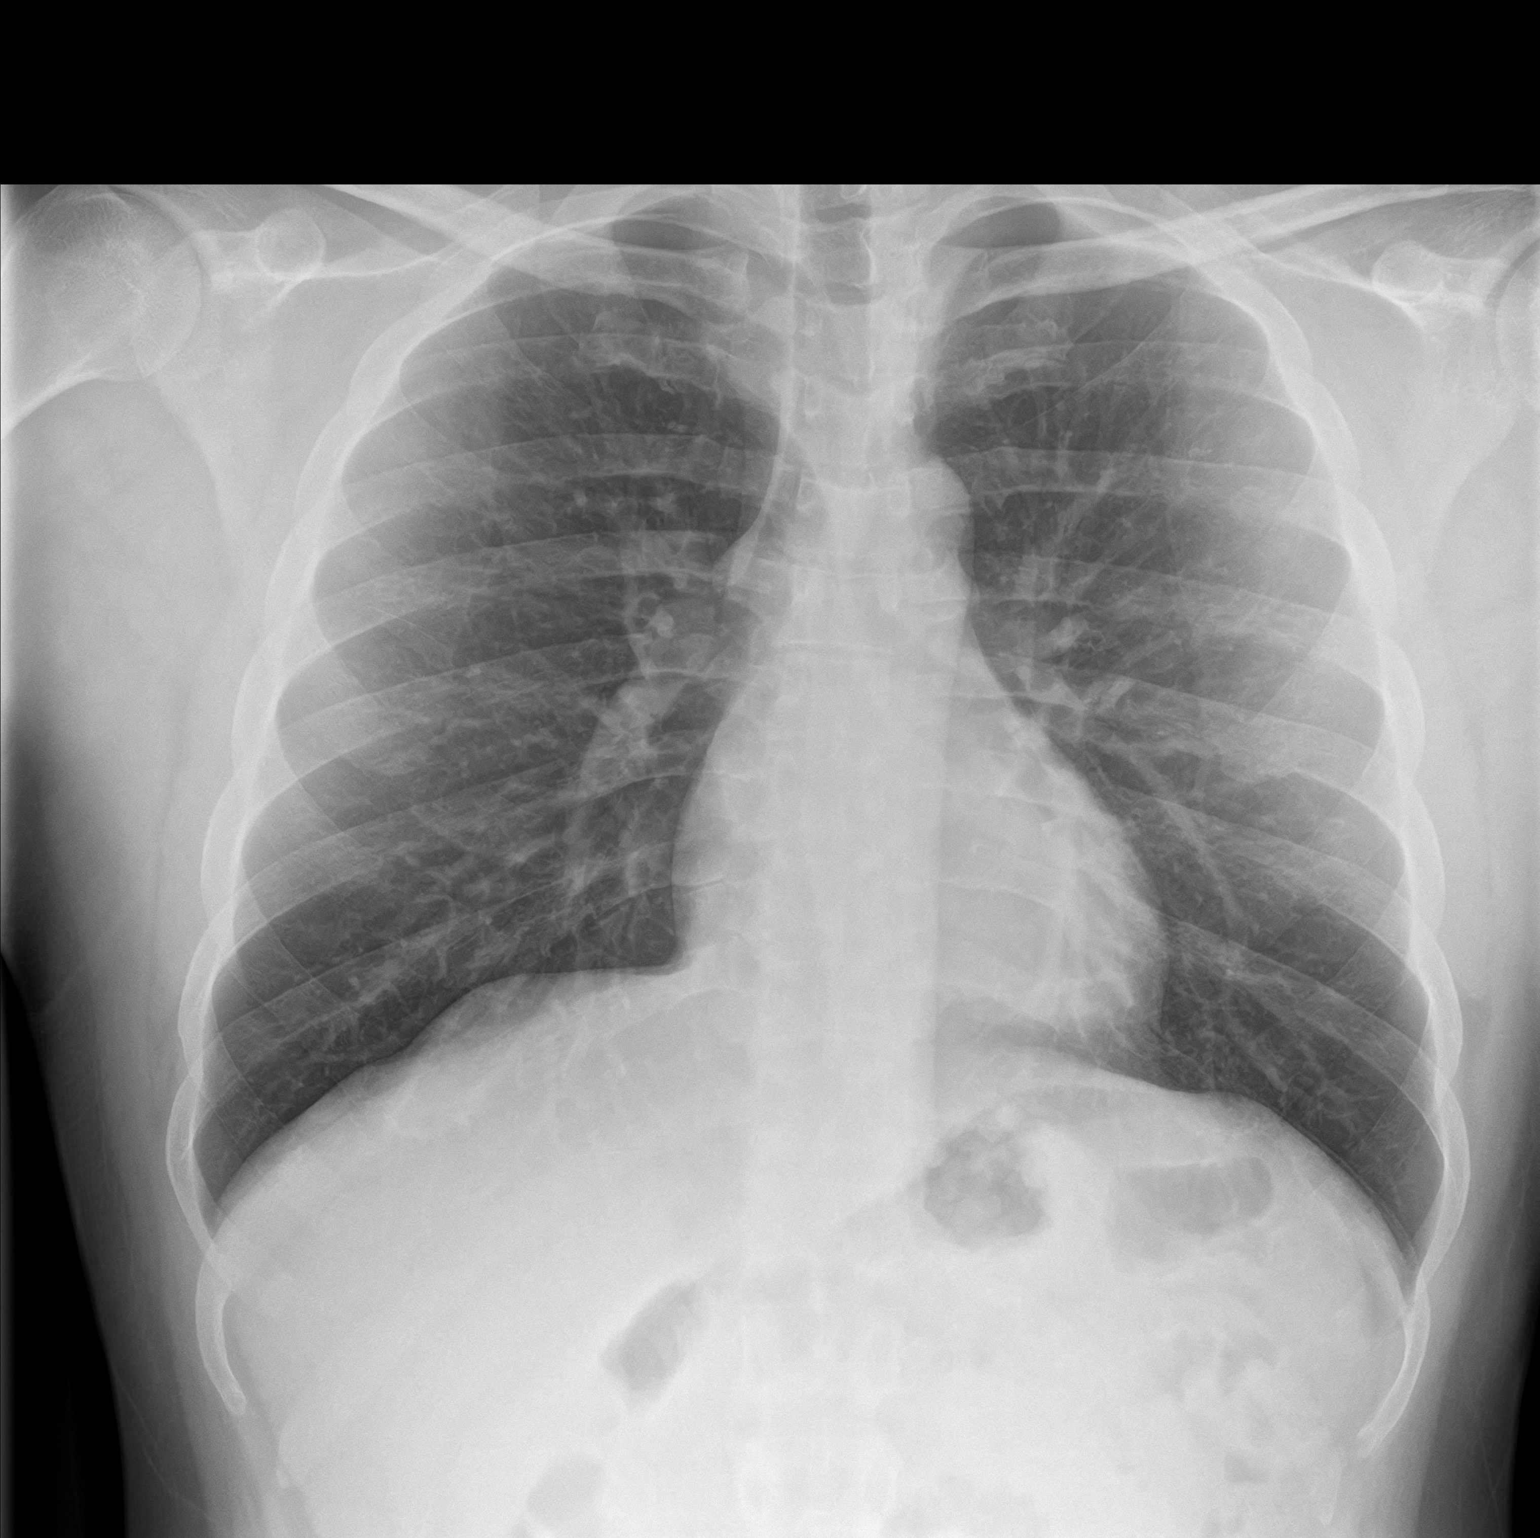
[im 2/2]
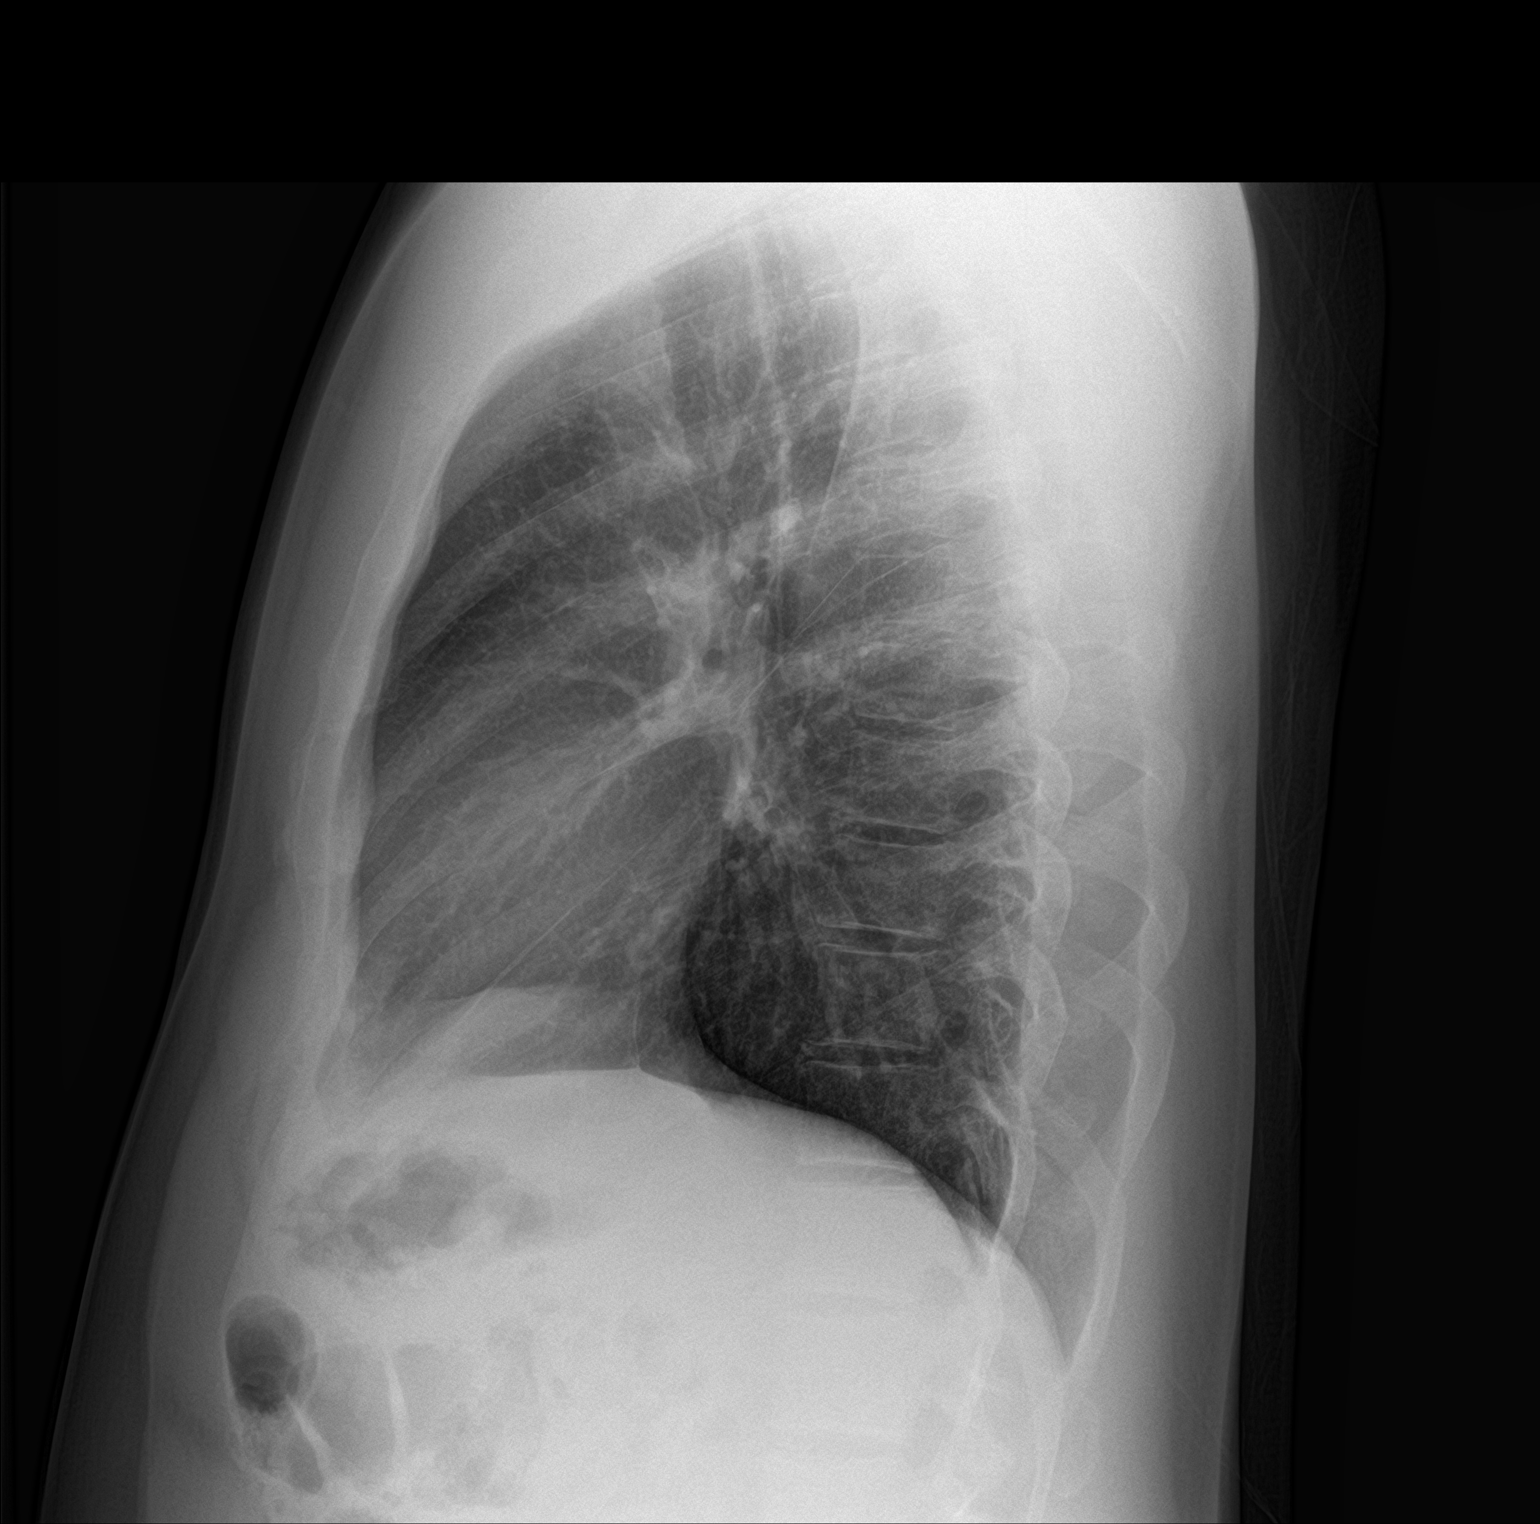

[2 of 2 positions shown; findings below may reference images not displayed]

FINDINGS: The heart size and mediastinal contours are within normal limits.
Both lungs are clear. The visualized skeletal structures are
unremarkable.
IMPRESSION: No active cardiopulmonary disease.
